# Patient Record
Sex: Female | Born: 1995 | Hispanic: No | Marital: Single | Smoking: Never smoker
Health system: Southern US, Community
[De-identification: ages and names within clinical notes are randomized; demographics above are authoritative.]

## PROBLEM LIST (undated history)

## (undated) ENCOUNTER — Emergency Department (HOSPITAL_COMMUNITY): Admission: EM | Payer: No Typology Code available for payment source | Source: Home / Self Care

## (undated) DIAGNOSIS — I10 Essential (primary) hypertension: Secondary | ICD-10-CM

## (undated) DIAGNOSIS — O139 Gestational [pregnancy-induced] hypertension without significant proteinuria, unspecified trimester: Secondary | ICD-10-CM

## (undated) HISTORY — PX: NO PAST SURGERIES: SHX2092

---
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
## 2015-11-12 LAB — OB RESULTS CONSOLE HEPATITIS B SURFACE ANTIGEN: Hepatitis B Surface Ag: NEGATIVE

## 2015-11-12 LAB — OB RESULTS CONSOLE GC/CHLAMYDIA
Chlamydia: NEGATIVE
Gonorrhea: NEGATIVE

## 2015-11-12 LAB — OB RESULTS CONSOLE RPR: RPR: NONREACTIVE

## 2015-11-12 LAB — OB RESULTS CONSOLE HIV ANTIBODY (ROUTINE TESTING): HIV: NONREACTIVE

## 2015-11-12 LAB — OB RESULTS CONSOLE ABO/RH: RH TYPE: POSITIVE

## 2015-11-12 LAB — OB RESULTS CONSOLE RUBELLA ANTIBODY, IGM: RUBELLA: IMMUNE

## 2015-11-19 LAB — OB RESULTS CONSOLE GBS: GBS: NEGATIVE

## 2015-12-13 ENCOUNTER — Emergency Department (HOSPITAL_COMMUNITY): Payer: Medicaid Other

## 2015-12-13 ENCOUNTER — Encounter (HOSPITAL_COMMUNITY): Payer: Self-pay | Admitting: *Deleted

## 2015-12-13 ENCOUNTER — Inpatient Hospital Stay (HOSPITAL_COMMUNITY)
Admission: EM | Admit: 2015-12-13 | Discharge: 2015-12-16 | DRG: 775 | Disposition: A | Discharge status: Home / Self Care | Payer: Medicaid Other | Attending: Family Medicine | Admitting: Family Medicine

## 2015-12-13 DIAGNOSIS — Z3A39 39 weeks gestation of pregnancy: Secondary | ICD-10-CM

## 2015-12-13 DIAGNOSIS — O139 Gestational [pregnancy-induced] hypertension without significant proteinuria, unspecified trimester: Secondary | ICD-10-CM | POA: Diagnosis present

## 2015-12-13 DIAGNOSIS — Y92414 Local residential or business street as the place of occurrence of the external cause: Secondary | ICD-10-CM | POA: Diagnosis not present

## 2015-12-13 DIAGNOSIS — O134 Gestational [pregnancy-induced] hypertension without significant proteinuria, complicating childbirth: Principal | ICD-10-CM | POA: Diagnosis present

## 2015-12-13 DIAGNOSIS — O133 Gestational [pregnancy-induced] hypertension without significant proteinuria, third trimester: Secondary | ICD-10-CM

## 2015-12-13 HISTORY — DX: Essential (primary) hypertension: I10

## 2015-12-13 HISTORY — DX: Gestational (pregnancy-induced) hypertension without significant proteinuria, unspecified trimester: O13.9

## 2015-12-13 LAB — TYPE AND SCREEN
ABO/RH(D): A POS
ANTIBODY SCREEN: NEGATIVE

## 2015-12-13 LAB — CBC WITH DIFFERENTIAL/PLATELET
Basophils Absolute: 0 10*3/uL (ref 0.0–0.1)
Basophils Relative: 0 %
EOS ABS: 0.1 10*3/uL (ref 0.0–0.7)
EOS PCT: 1 %
HCT: 37.5 % (ref 36.0–46.0)
Hemoglobin: 11.9 g/dL — ABNORMAL LOW (ref 12.0–15.0)
LYMPHS ABS: 2.8 10*3/uL (ref 0.7–4.0)
LYMPHS PCT: 24 %
MCH: 28.5 pg (ref 26.0–34.0)
MCHC: 31.7 g/dL (ref 30.0–36.0)
MCV: 89.9 fL (ref 78.0–100.0)
MONOS PCT: 9 %
Monocytes Absolute: 1.1 10*3/uL — ABNORMAL HIGH (ref 0.1–1.0)
Neutro Abs: 7.9 10*3/uL — ABNORMAL HIGH (ref 1.7–7.7)
Neutrophils Relative %: 66 %
PLATELETS: 227 10*3/uL (ref 150–400)
RBC: 4.17 MIL/uL (ref 3.87–5.11)
RDW: 16 % — ABNORMAL HIGH (ref 11.5–15.5)
WBC: 11.9 10*3/uL — ABNORMAL HIGH (ref 4.0–10.5)

## 2015-12-13 LAB — COMPREHENSIVE METABOLIC PANEL
ALK PHOS: 173 U/L — AB (ref 38–126)
ALT: 15 U/L (ref 14–54)
ANION GAP: 10 (ref 5–15)
AST: 28 U/L (ref 15–41)
Albumin: 2.9 g/dL — ABNORMAL LOW (ref 3.5–5.0)
BUN: 8 mg/dL (ref 6–20)
CALCIUM: 9 mg/dL (ref 8.9–10.3)
CHLORIDE: 106 mmol/L (ref 101–111)
CO2: 20 mmol/L — AB (ref 22–32)
CREATININE: 0.45 mg/dL (ref 0.44–1.00)
Glucose, Bld: 99 mg/dL (ref 65–99)
Potassium: 3.9 mmol/L (ref 3.5–5.1)
SODIUM: 136 mmol/L (ref 135–145)
Total Bilirubin: <0.1 mg/dL — ABNORMAL LOW (ref 0.3–1.2)
Total Protein: 6.1 g/dL — ABNORMAL LOW (ref 6.5–8.1)

## 2015-12-13 MED ORDER — LACTATED RINGERS IV SOLN: 500.0000 mL | INTRAVENOUS | Status: DC | PRN

## 2015-12-13 MED ORDER — LACTATED RINGERS IV BOLUS (SEPSIS)
1000.0000 mL | Freq: Once | INTRAVENOUS | Status: AC
  Administered 2015-12-13: 1000 mL via INTRAVENOUS

## 2015-12-13 MED ORDER — ACETAMINOPHEN 325 MG PO TABS: 650.0000 mg | ORAL_TABLET | ORAL | Status: DC | PRN

## 2015-12-13 MED ORDER — ONDANSETRON HCL 4 MG/2ML IJ SOLN: 4.0000 mg | Freq: Four times a day (QID) | INTRAMUSCULAR | Status: DC | PRN

## 2015-12-13 MED ORDER — ACETAMINOPHEN 325 MG PO TABS
325.0000 mg | ORAL_TABLET | Freq: Once | ORAL | Status: AC
  Administered 2015-12-13: 325 mg via ORAL
  Filled 2015-12-13: qty 1

## 2015-12-13 MED ORDER — HYDROXYZINE HCL 50 MG PO TABS
50.0000 mg | ORAL_TABLET | Freq: Four times a day (QID) | ORAL | Status: DC | PRN
  Filled 2015-12-13: qty 1

## 2015-12-13 MED ORDER — SODIUM CHLORIDE 0.9% FLUSH: 3.0000 mL | Freq: Two times a day (BID) | INTRAVENOUS | Status: DC

## 2015-12-13 MED ORDER — OXYTOCIN BOLUS FROM INFUSION
500.0000 mL | Freq: Once | INTRAVENOUS | Status: AC
  Administered 2015-12-14: 500 mL via INTRAVENOUS

## 2015-12-13 MED ORDER — LIDOCAINE HCL (PF) 1 % IJ SOLN
30.0000 mL | INTRAMUSCULAR | Status: AC | PRN
  Administered 2015-12-14: 30 mL via SUBCUTANEOUS
  Filled 2015-12-13: qty 30

## 2015-12-13 MED ORDER — MISOPROSTOL 25 MCG QUARTER TABLET
25.0000 ug | ORAL_TABLET | ORAL | Status: DC | PRN
  Administered 2015-12-14: 25 ug via VAGINAL
  Filled 2015-12-13: qty 0.25
  Filled 2015-12-13: qty 1

## 2015-12-13 MED ORDER — TERBUTALINE SULFATE 1 MG/ML IJ SOLN
0.2500 mg | Freq: Once | INTRAMUSCULAR | Status: DC | PRN
  Filled 2015-12-13: qty 1

## 2015-12-13 MED ORDER — OXYTOCIN 10 UNIT/ML IJ SOLN: 10.0000 [IU] | Freq: Once | INTRAMUSCULAR | Status: DC

## 2015-12-13 MED ORDER — LACTATED RINGERS IV SOLN
INTRAVENOUS | Status: DC
  Administered 2015-12-13: 20:00:00 via INTRAVENOUS

## 2015-12-13 MED ORDER — SOD CITRATE-CITRIC ACID 500-334 MG/5ML PO SOLN
30.0000 mL | ORAL | Status: DC | PRN
Start: 2015-12-13 — End: 2015-12-14

## 2015-12-13 MED ORDER — OXYCODONE-ACETAMINOPHEN 5-325 MG PO TABS: 2.0000 | ORAL_TABLET | ORAL | Status: DC | PRN

## 2015-12-13 MED ORDER — OXYTOCIN 40 UNITS IN LACTATED RINGERS INFUSION - SIMPLE MED
2.5000 [IU]/h | INTRAVENOUS | Status: DC
  Filled 2015-12-13: qty 1000

## 2015-12-13 MED ORDER — LACTATED RINGERS IV SOLN
INTRAVENOUS | Status: DC
  Administered 2015-12-14: 06:00:00 via INTRAVENOUS

## 2015-12-13 MED ORDER — FENTANYL CITRATE (PF) 100 MCG/2ML IJ SOLN
50.0000 ug | INTRAMUSCULAR | Status: DC | PRN
  Administered 2015-12-14: 100 ug via INTRAVENOUS
  Administered 2015-12-14: 50 ug via INTRAVENOUS
  Filled 2015-12-13 (×2): qty 2

## 2015-12-13 MED ORDER — SODIUM CHLORIDE 0.9 % IV SOLN: 250.0000 mL | INTRAVENOUS | Status: DC | PRN

## 2015-12-13 MED ORDER — SODIUM CHLORIDE 0.9% FLUSH: 3.0000 mL | INTRAVENOUS | Status: DC | PRN

## 2015-12-13 MED ORDER — OXYCODONE-ACETAMINOPHEN 5-325 MG PO TABS: 1.0000 | ORAL_TABLET | ORAL | Status: DC | PRN

## 2015-12-13 MED ORDER — IOPAMIDOL (ISOVUE-300) INJECTION 61%
INTRAVENOUS | Status: AC
  Administered 2015-12-13: 100 mL
  Filled 2015-12-13: qty 100
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
## 2015-12-13 NOTE — ED Notes (Signed)
Family at beside. Family given emotional support. 
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
## 2015-12-13 NOTE — ED Notes (Signed)
Patient transported to CT 
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
## 2015-12-13 NOTE — ED Triage Notes (Signed)
\  [id number]\

## 2015-12-14 ENCOUNTER — Encounter (HOSPITAL_COMMUNITY): Payer: Self-pay | Admitting: *Deleted

## 2015-12-14 DIAGNOSIS — O134 Gestational [pregnancy-induced] hypertension without significant proteinuria, complicating childbirth: Secondary | ICD-10-CM

## 2015-12-14 DIAGNOSIS — Z3A39 39 weeks gestation of pregnancy: Secondary | ICD-10-CM

## 2015-12-14 LAB — ABO/RH
ABO/RH(D): A POS
ABO/RH(D): A POS

## 2015-12-14 LAB — TYPE AND SCREEN
ABO/RH(D): A POS
ANTIBODY SCREEN: NEGATIVE

## 2015-12-14 LAB — PROTEIN / CREATININE RATIO, URINE
Creatinine, Urine: 30 mg/dL
Protein Creatinine Ratio: 0.23 mg/mg{Cre} — ABNORMAL HIGH (ref 0.00–0.15)
Total Protein, Urine: 7 mg/dL

## 2015-12-14 MED ORDER — SENNOSIDES-DOCUSATE SODIUM 8.6-50 MG PO TABS
2.0000 | ORAL_TABLET | ORAL | Status: DC
  Administered 2015-12-15: 2 via ORAL
  Filled 2015-12-14 (×2): qty 2

## 2015-12-14 MED ORDER — BENZOCAINE-MENTHOL 20-0.5 % EX AERO
1.0000 "application " | INHALATION_SPRAY | CUTANEOUS | Status: DC | PRN
  Administered 2015-12-14 – 2015-12-16 (×2): 1 via TOPICAL
  Filled 2015-12-14 (×2): qty 56

## 2015-12-14 MED ORDER — OXYTOCIN 40 UNITS IN LACTATED RINGERS INFUSION - SIMPLE MED
1.0000 m[IU]/min | INTRAVENOUS | Status: DC
  Administered 2015-12-14: 2 m[IU]/min via INTRAVENOUS

## 2015-12-14 MED ORDER — PRENATAL MULTIVITAMIN CH
1.0000 | ORAL_TABLET | Freq: Every day | ORAL | Status: DC
  Administered 2015-12-15: 1 via ORAL
  Filled 2015-12-14: qty 1

## 2015-12-14 MED ORDER — COCONUT OIL OIL: 1.0000 "application " | TOPICAL_OIL | Status: DC | PRN

## 2015-12-14 MED ORDER — TETANUS-DIPHTH-ACELL PERTUSSIS 5-2.5-18.5 LF-MCG/0.5 IM SUSP: 0.5000 mL | Freq: Once | INTRAMUSCULAR | Status: DC

## 2015-12-14 MED ORDER — DIPHENHYDRAMINE HCL 25 MG PO CAPS: 25.0000 mg | ORAL_CAPSULE | Freq: Four times a day (QID) | ORAL | Status: DC | PRN

## 2015-12-14 MED ORDER — ZOLPIDEM TARTRATE 5 MG PO TABS: 5.0000 mg | ORAL_TABLET | Freq: Every evening | ORAL | Status: DC | PRN

## 2015-12-14 MED ORDER — OXYCODONE HCL 5 MG PO TABS: 10.0000 mg | ORAL_TABLET | ORAL | Status: DC | PRN

## 2015-12-14 MED ORDER — WITCH HAZEL-GLYCERIN EX PADS: 1.0000 "application " | MEDICATED_PAD | CUTANEOUS | Status: DC | PRN

## 2015-12-14 MED ORDER — ONDANSETRON HCL 4 MG PO TABS: 4.0000 mg | ORAL_TABLET | ORAL | Status: DC | PRN

## 2015-12-14 MED ORDER — ONDANSETRON HCL 4 MG/2ML IJ SOLN: 4.0000 mg | INTRAMUSCULAR | Status: DC | PRN

## 2015-12-14 MED ORDER — SIMETHICONE 80 MG PO CHEW: 80.0000 mg | CHEWABLE_TABLET | ORAL | Status: DC | PRN

## 2015-12-14 MED ORDER — OXYCODONE HCL 5 MG PO TABS: 5.0000 mg | ORAL_TABLET | ORAL | Status: DC | PRN

## 2015-12-14 MED ORDER — ACETAMINOPHEN 325 MG PO TABS
650.0000 mg | ORAL_TABLET | ORAL | Status: DC | PRN
  Administered 2015-12-14 – 2015-12-15 (×3): 650 mg via ORAL
  Filled 2015-12-14 (×3): qty 2

## 2015-12-14 MED ORDER — MEASLES, MUMPS & RUBELLA VAC ~~LOC~~ INJ
0.5000 mL | INJECTION | Freq: Once | SUBCUTANEOUS | Status: DC
  Filled 2015-12-14: qty 0.5

## 2015-12-14 MED ORDER — TERBUTALINE SULFATE 1 MG/ML IJ SOLN
0.2500 mg | Freq: Once | INTRAMUSCULAR | Status: DC | PRN
  Filled 2015-12-14: qty 1

## 2015-12-14 MED ORDER — METHYLERGONOVINE MALEATE 0.2 MG/ML IJ SOLN: 0.2000 mg | INTRAMUSCULAR | Status: DC | PRN

## 2015-12-14 MED ORDER — METHYLERGONOVINE MALEATE 0.2 MG PO TABS: 0.2000 mg | ORAL_TABLET | ORAL | Status: DC | PRN

## 2015-12-14 MED ORDER — IBUPROFEN 600 MG PO TABS
600.0000 mg | ORAL_TABLET | Freq: Four times a day (QID) | ORAL | Status: DC
  Administered 2015-12-14 – 2015-12-16 (×7): 600 mg via ORAL
  Filled 2015-12-14 (×8): qty 1

## 2015-12-14 MED ORDER — DIBUCAINE 1 % RE OINT: 1.0000 "application " | TOPICAL_OINTMENT | RECTAL | Status: DC | PRN

## 2015-12-14 NOTE — Anesthesia Pain Management Evaluation Note (Signed)
  CRNA Pain Management Visit Note  Patient: Janet Sullivan, 20 y.o., female  "Hello I am a member of the anesthesia team at [facility]. We have an anesthesia team available at all times to provide care throughout the hospital, including epidural management and anesthesia for C-section. I don't know your plan for the delivery whether it a natural birth, water birth, IV sedation, nitrous supplementation, doula or epidural, but we want to meet your pain goals."   1.Was your pain managed to your expectations on prior hospitalizations?   Yes   2.What is your expectation for pain management during this hospitalization?     Labor support without medications. Not planning on having an epidural  3.How can we help you reach that goal? Explain my options , other than epidural.  Record the patient's initial score and the patient's pain goal.   Pain: 9  Pain Goal: 10 [facility] wants you to be able to say your pain was always managed very well.  Janet Sullivan 12/14/2015
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
## 2015-12-14 NOTE — Lactation Note (Signed)
This note was copied from a baby's chart. Lactation Consultation Note Initial visit at 11 hours of age.  Mom reports good feedings and denies pain with latch.  Baby just received 1st bath and placed STS with mom.  Mom reports recent feeding prior to bath and now baby is asleep on mom. [facility] LC resources given and discussed.  Encouraged to feed with early cues on demand 8-12x/day.  Early newborn behavior discussed.  Hand expression demonstrated by mom with colostrum visible.  LC [facility]   Mom to call for assist as needed.    Patient Name: Janet Sullivan ZOXWR'UToday's Date: 12/14/2015 Reason for consult: Initial assessment   Maternal Data Has patient been taught Hand Expression?: Yes Does the patient have breastfeeding experience prior to this delivery?: No  Feeding Feeding Type: Breast Fed  LATCH Score/Interventions Latch: Repeated attempts needed to sustain latch, nipple held in mouth throughout feeding, stimulation needed to elicit sucking reflex. Intervention(s): Adjust position  Audible Swallowing: A few with stimulation Intervention(s): Skin to skin  Type of Nipple: Everted at rest and after stimulation  Comfort (Breast/Nipple): Soft / non-tender     Hold (Positioning): Assistance needed to correctly position infant at breast and maintain latch. Intervention(s): Breastfeeding basics reviewed;Skin to skin  LATCH Score: 7  Lactation Tools Discussed/Used WIC Program: Yes (will call for appointment)   Consult Status Consult Status: Follow-up Date: 12/15/15 Follow-up type: In-patient    Annalyce Lanpher, Arvella MerlesJana Lynn 12/14/2015, 10:57 PM

## 2015-12-15 LAB — RPR: RPR: NONREACTIVE

## 2015-12-15 NOTE — Progress Notes (Signed)
  CLINICAL SOCIAL WORK MATERNAL/CHILD NOTE  Patient Details  Name: Janet Sullivan MRN: [id number] Date of Birth: 12/14/2015  Date:  12/15/2015  Clinical Social Worker Initiating Note:  Janet Sullivan, [place] Date/ Time Initiated:  12/15/15/1430     Child's Name:  Janet Sullivan   Legal Guardian:  Mother Janet Sullivan)   Need for Interpreter:  None   Date of Referral:  12/15/15     Reason for Referral:  Late or No Prenatal Care    Referral Source:  Central Nursery   Address:  [phone] [address] [place], [place]   Phone number:  [phone]   Household Members:  Parents, Siblings (MOB states she lives with her parents and two sisters: age 72 and 39)   Natural Supports (not living in the home):  Friends, Immediate Family, Extended Family (MOB states she has "plenty" of friends and family who support her and live locally.)   Professional Supports: None   Employment:     Type of Work:     Education:      Pensions consultant:  Medicaid   Other Resources:      Cultural/Religious Considerations Which May Impact Care: None stated.  Strengths:  Ability to meet basic needs , Pediatrician chosen , Home prepared for child  (Pediatric follow up will be at [facility] in [place])   Risk Factors/Current Problems:  None   Cognitive State:  Alert , Able to Concentrate , Linear Thinking    Mood/Affect:  Interested , Calm , Comfortable    CSW Assessment: CSW met with MOB in her first floor room/110 to complete assessment due to [facility] at 02.6 weeks.  MOB was sitting in bed, holding baby, and receptive to CSW's visit.  She was pleasant and easy to engage.  Her father was with her and she gave permission to speak openly in front of him.  She was attentive to her baby while CSW spoke with her and her father was also involved in caring for baby.  Her father appears supportive.   MOB reports she and baby are doing well.  She states she has a great support system and that FOB  is not involved.  She does not identify this as a source of stress, but rather feels she is better off without him involved.  MOB reports having all necessary supplies for infant at home. MOB reports that she had to wait a long time after applying for Medicaid and then another month before she could be scheduled [facility].  CSW stated understanding and informed her of the hospital drug screen policy and mandated reporting for positive screens.  CSW informed MOB that baby's UDS is negative.  MOB states no concerns and denies all substance use.   CSW provided education regarding perinatal mood disorders and stressed the importance of talking with a medical professional if she has concerns about her emotions at any time.  MOB was attentive to information being given and commits to reporting if symptoms arise.  She states no emotional concerns at this time. CSW will monitor cord tissue drug screen results and identifies no barriers to discharge.  CSW Plan/Description:  No Further Intervention Required/No Barriers to Discharge, Patient/Family Education     Janet Sullivan, [place] 12/15/2015, 3:27 PM

## 2015-12-15 NOTE — Progress Notes (Signed)
UR chart review completed.  

## 2015-12-15 NOTE — Lactation Note (Signed)
This note was copied from a baby's chart. Lactation Consultation Note  Patient Name: Janet Sullivan NWGNF'AToday's Date: 12/15/2015 Reason for consult: Follow-up assessment;Infant weight loss (3 % weight loss ) Baby is 24 hours old and has been to the breast consistently.  Has LC walked in the room per mom baby had just finished feeding on the left breast 30 mins  Cross cradle. Baby still acting hungry. Mom hand expressed 1st .  LC offered to assist with latch on the right breast / football/ Baby latched with ease and LC flipped upper lip to flanged position and eased chin down.  Multiple swallows noted , increased with breast compressions ( LC instructed mom ) . Per  Mom comfortable.  Baby fed 13 mins and released, nipple well rounded. [facility] - [facility] nipple and engorgement  Prevention and tx. LC instructed mom on the use of hand pump and checked the flange size . #24 a good fit.  Per mom active with WIC .  Mom receptive to teaching and breast feeding is going well.   Maternal Data Has patient been taught Hand Expression?: Yes  Feeding Feeding Type: Breast Fed Length of feed: 13 min (mutiple swallows / foortball / right breast )  LATCH Score/Interventions Latch: Grasps breast easily, tongue down, lips flanged, rhythmical sucking. Intervention(s): Skin to skin;Teach feeding cues;Waking techniques Intervention(s): Adjust position;Assist with latch;Breast massage;Breast compression  Audible Swallowing: Spontaneous and intermittent Intervention(s): Skin to skin;Hand expression Intervention(s): Hand expression  Type of Nipple: Everted at rest and after stimulation  Comfort (Breast/Nipple): Soft / non-tender     Hold (Positioning): Assistance needed to correctly position infant at breast and maintain latch. Intervention(s): Breastfeeding basics reviewed;Support Pillows;Position options;Skin to skin  LATCH Score: 9  Lactation Tools Discussed/Used Tools:  Pump (#24 Flange a good fit / LC checked ) Breast pump type: Manual (for D/C tomorrow ) WIC Program: Yes Pump Review: Setup, frequency, and cleaning;Milk Storage Initiated by:: MAI  Date initiated:: 12/15/15   Consult Status Consult Status: Follow-up Date: 12/16/15 Follow-up type: In-patient    Matilde SprangMargaret Ann Quantrell Splitt 12/15/2015, 11:57 AM

## 2015-12-15 NOTE — Progress Notes (Signed)
Patient ID: Janet Sullivan, female   DOB: August 27, 1995, 20 y.o.   MRN: [id number]   POSTPARTUM PROGRESS NOTE  Post Partum Day 1 Subjective:  Janet Sullivan is a 20 y.o. G2P1001 1760w5d s/p NSVD.  No acute events overnight.  Pt denies problems with ambulating, voiding or po intake.  She denies nausea or vomiting.  Pain is well controlled.  She has had flatus. She has had bowel movement.  Lochia Minimal.  Patient states she is otherwise doing well but not ready to go home.   Objective: Blood pressure 116/68, pulse 98, temperature 98.2 F (36.8 C), temperature source Oral, resp. rate 19, height 5\' 4"  (1.626 m), weight 161 lb (73 kg), SpO2 99 %, unknown if currently breastfeeding.  Physical Exam:  General: alert, cooperative and no distress Lochia:normal flow Chest: CTAB Heart: RRR no m/r/g Abdomen: +BS, soft, nontender,  Uterine Fundus: firm DVT Evaluation: No calf swelling or tenderness Extremities: No signs of edema   Recent Labs  12/13/15 1928  HGB 11.9*  HCT 37.5   Assessment/Plan:  ASSESSMENT: Janet Sullivan is a 20 y.o. G2P1001 1160w5d s/p NSVD.    Plan for discharge tomorrow   LOS: 2 days   Freddrick MarchYashika Amin, MD PGY-1 [facility], [facility]  12/15/2015, 7:27 AM   CNM attestation Post Partum Day #1 I have seen and examined this patient and agree with above documentation in the resident's note.   Janet Sullivan is a 20 y.o. G2P1001 s/p SVD.  Pt denies problems with ambulating, voiding or po intake. Pain is well controlled.  Plan for birth control is oral progesterone-only contraceptive.  Method of Feeding: breast  PE:  BP 116/68   Pulse 98   Temp 98.2 F (36.8 C) (Oral)   Resp 19   Ht 5\' 4"  (1.626 m)   Wt 73 kg (161 lb)   SpO2 99%   Breastfeeding? Unknown   BMI 27.64 kg/m  Fundus firm  Plan for discharge: 12/16/15  Cam HaiSHAW, Codie Krogh, CNM 9:19 AM  12/15/2015

## 2015-12-16 ENCOUNTER — Encounter (HOSPITAL_COMMUNITY): Payer: Self-pay

## 2015-12-16 MED ORDER — IBUPROFEN 600 MG PO TABS: 600.0000 mg | ORAL_TABLET | Freq: Four times a day (QID) | ORAL | 0 refills | Status: AC

## 2015-12-16 MED ORDER — SENNOSIDES-DOCUSATE SODIUM 8.6-50 MG PO TABS: 2.0000 | ORAL_TABLET | ORAL | 0 refills | Status: AC

## 2015-12-16 NOTE — Discharge Instructions (Signed)

## 2015-12-16 NOTE — Plan of Care (Signed)
Problem: Education: Goal: Knowledge of condition will improve Outcome: Completed/Met Date Met: 12/16/15 Discharge education and paperwork discussed. Reasons to call the MD reviewed.  Pt denies questions.

## 2015-12-16 NOTE — Lactation Note (Signed)
This note was copied from a baby's chart. Lactation Consultation Note  Mother states she has can easily hand express and baby has been cluster feeding. Upon entering baby latched fully wrapped in blanket but very few sucks and swallows observed, baby hanging out at breast. Recently bf for 25 min.   Discussed watching for active feeding and how to compress breast during feeding. Reminded mother to compress breast during feeding. Took baby off breast and he fell asleep. Reviewed engorgement care and monitoring voids/stools. Mother has good support at home with herm other that breastfed 7 children. Left lactation phone number and suggest she call if she would like us to observe another feeding.  Patient Name: Boy Ida Roguedriana Hildebrandt JQZES'PToday's Date: 12/16/2015 Reason for consult: Follow-up assessment   Maternal Data    Feeding Feeding Type: Breast Fed Length of feed: 10 min  LATCH Score/Interventions Latch: Grasps breast easily, tongue down, lips flanged, rhythmical sucking. (latched upon entering) Intervention(s): Skin to skin;Teach feeding cues;Waking techniques Intervention(s): Adjust position;Assist with latch;Breast compression  Audible Swallowing: A few with stimulation Intervention(s): Skin to skin;Hand expression Intervention(s): Skin to skin;Hand expression  Type of Nipple: Everted at rest and after stimulation Intervention(s): No intervention needed  Comfort (Breast/Nipple): Filling, red/small blisters or bruises, mild/mod discomfort     Hold (Positioning): Assistance needed to correctly position infant at breast and maintain latch. Intervention(s): Breastfeeding basics reviewed;Support Pillows;Position options;Skin to skin  LATCH Score: 7  Lactation Tools Discussed/Used     Consult Status Consult Status: Complete    Hardie PulleyBerkelhammer, Keontay Vora Boschen 12/16/2015, 9:56 AM
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
## 2015-12-23 ENCOUNTER — Inpatient Hospital Stay (HOSPITAL_COMMUNITY): Admission: RE | Admit: 2015-12-23 | Payer: No Typology Code available for payment source | Source: Ambulatory Visit
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
